# Patient Record
Sex: Male | Born: 2000 | Race: Black or African American | Hispanic: No | Marital: Single | Smoking: Never smoker
Health system: Southern US, Community
[De-identification: ages and names within clinical notes are randomized; demographics above are authoritative.]

## PROBLEM LIST (undated history)

## (undated) DIAGNOSIS — A64 Unspecified sexually transmitted disease: Secondary | ICD-10-CM

## (undated) DIAGNOSIS — J45909 Unspecified asthma, uncomplicated: Secondary | ICD-10-CM

## (undated) HISTORY — DX: Unspecified asthma, uncomplicated: J45.909

## (undated) HISTORY — PX: WISDOM TOOTH EXTRACTION: SHX21

## (undated) HISTORY — DX: Unspecified sexually transmitted disease: A64

---
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
## 2019-07-17 IMAGING — CT CT ABD-PELV W/ CM
2 of 4 series · 16 of 46 positions shown, 18 images · IV contrast (APPLIED)
Comparison: None.

CLINICAL DATA: 16 y/o  M; lower abdominal pain and blood in stool.

EXAM:
CT ABDOMEN AND PELVIS WITH CONTRAST
TECHNIQUE: Multidetector CT imaging of the abdomen and pelvis was performed
using the standard protocol following bolus administration of
intravenous contrast.
CONTRAST:  100mL QONZW0-JXX IOPAMIDOL (QONZW0-JXX) INJECTION 61%

[Series 2: routine abd/pel with · axial · 0.66mm/px · z∈[-566,-146]mm · 13 of 94 slices shown, 15 images]
[im 5/94  soft-tissue]
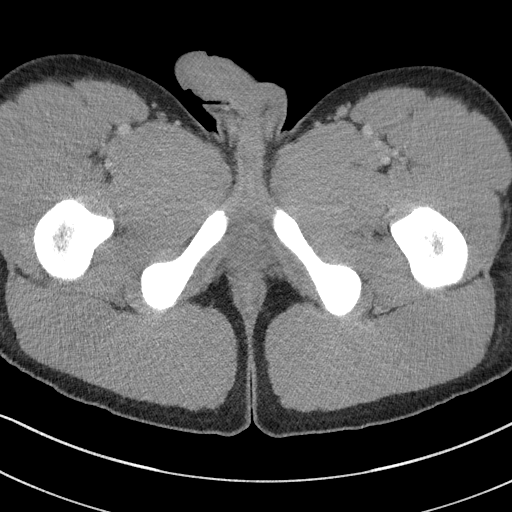
[im 5/94  bone]
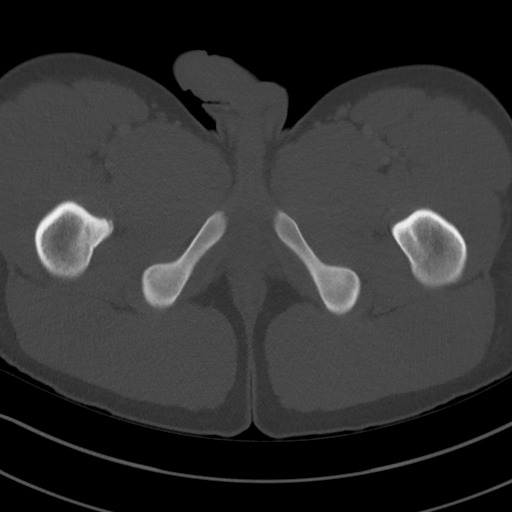
[im 13/94  soft-tissue]
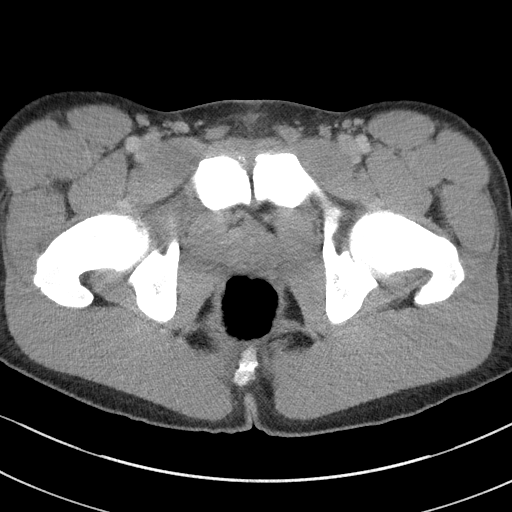
[im 21/94  soft-tissue]
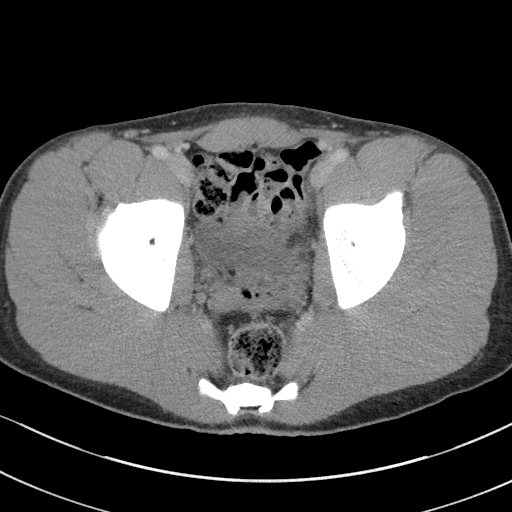
[im 25/94  soft-tissue]
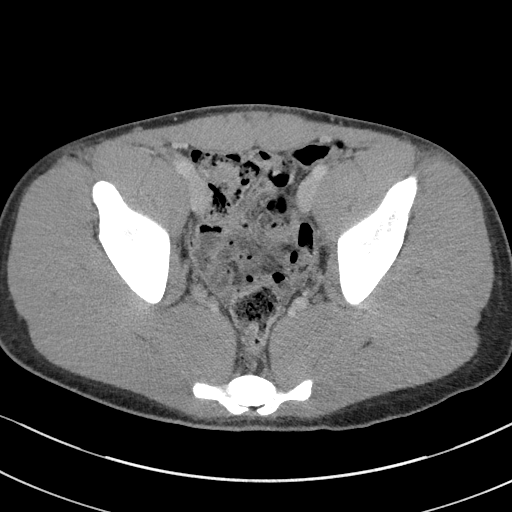
[im 33/94  soft-tissue]
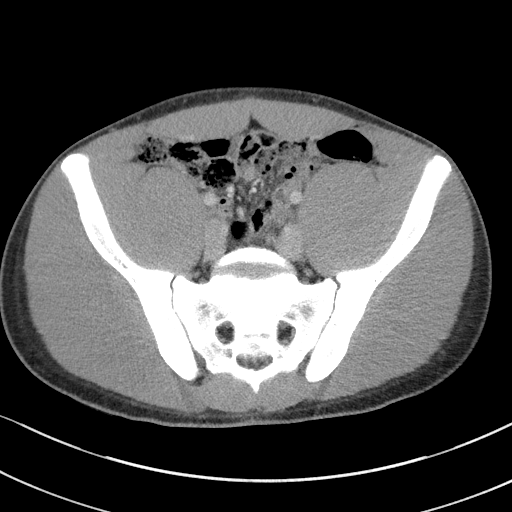
[im 41/94  soft-tissue]
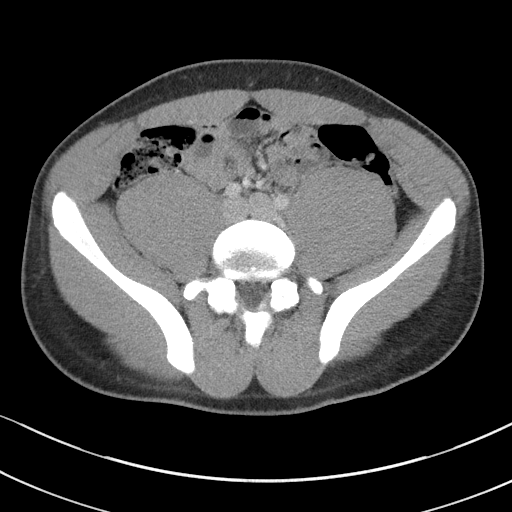
[im 49/94  soft-tissue]
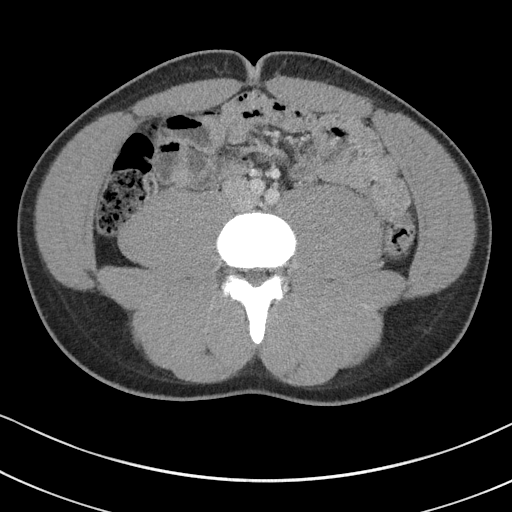
[im 53/94  soft-tissue]
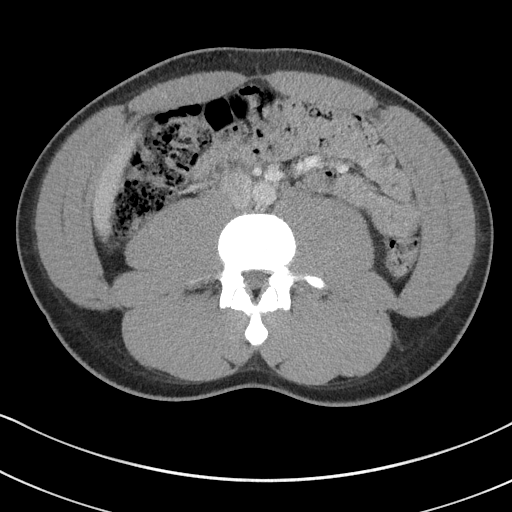
[im 61/94  soft-tissue]
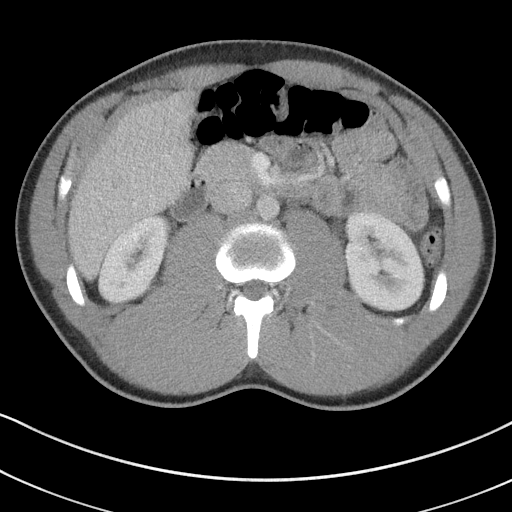
[im 61/94  bone]
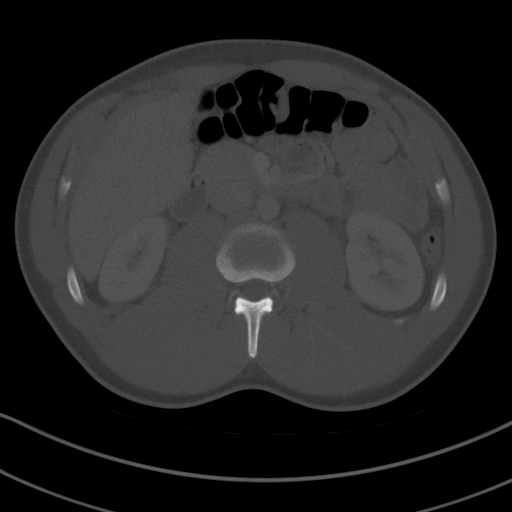
[im 69/94  soft-tissue]
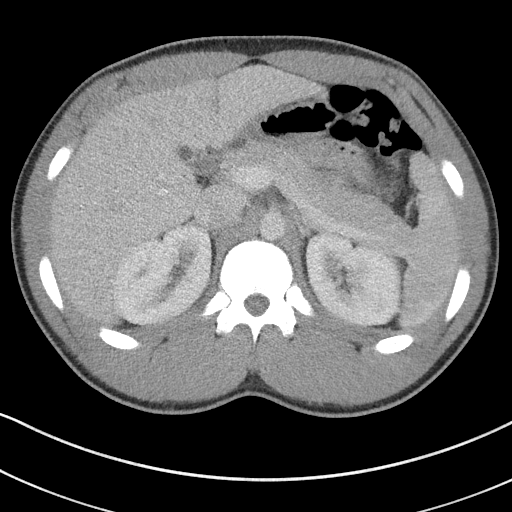
[im 73/94  soft-tissue]
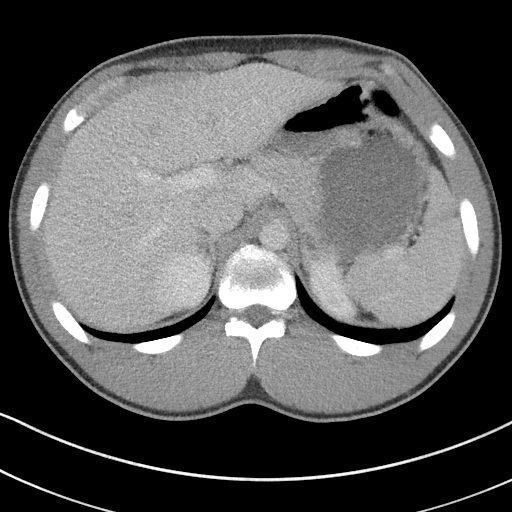
[im 81/94  soft-tissue]
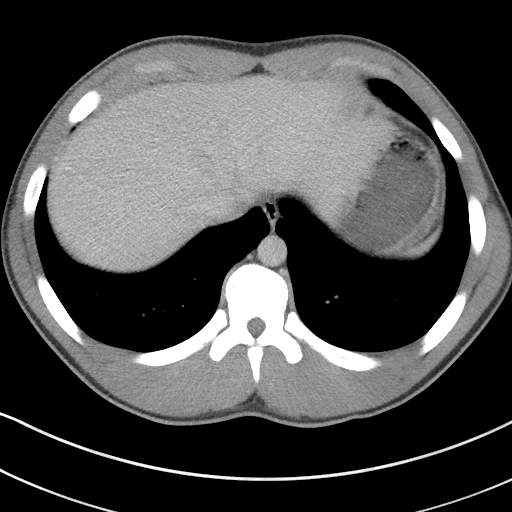
[im 89/94  soft-tissue]
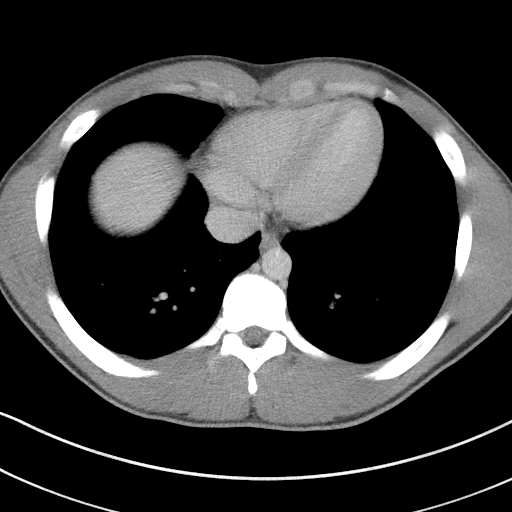

[Series 5: coronal st · coronal · 0.68mm/px · 3 of 89 slices shown]
[im 30/89  soft-tissue]
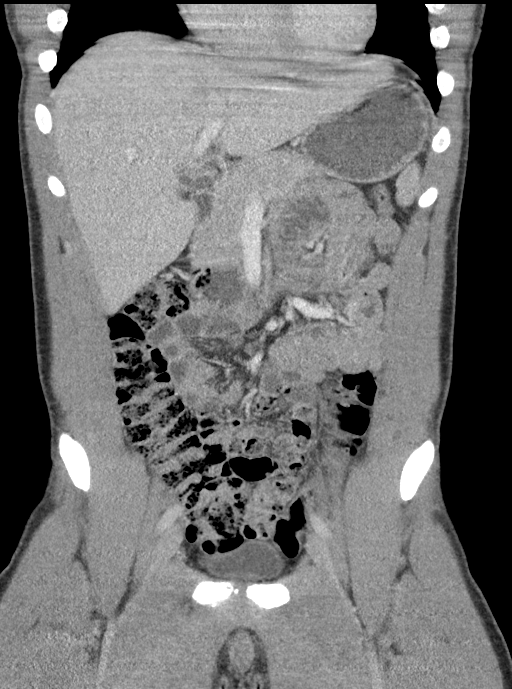
[im 40/89  soft-tissue]
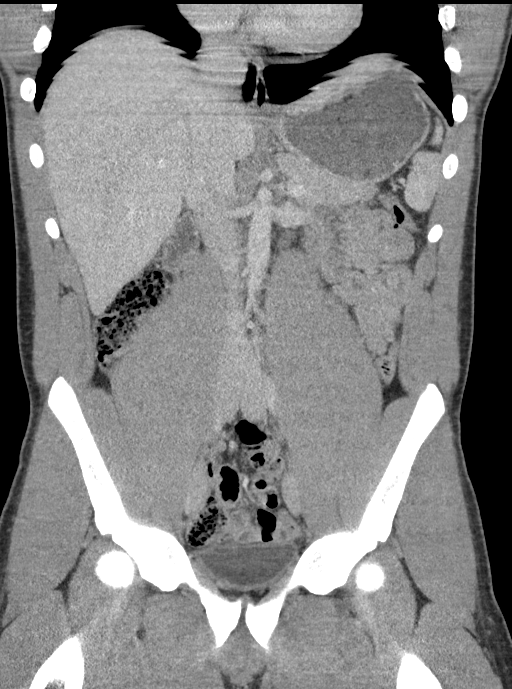
[im 49/89  soft-tissue]
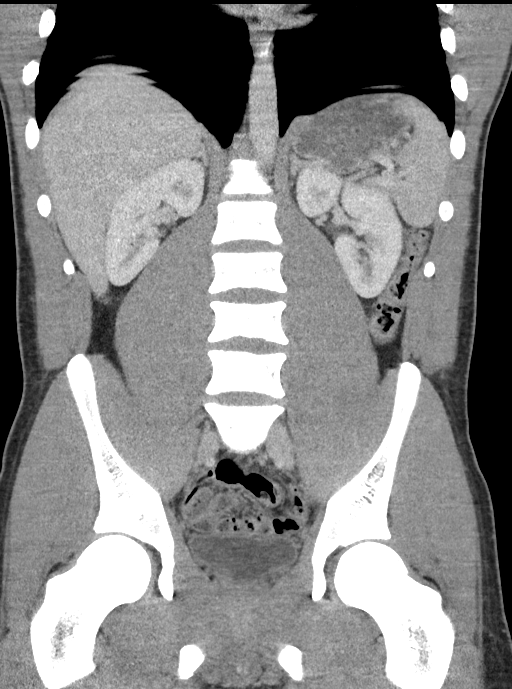

[16 of 46 positions shown; findings below may reference images not displayed]

FINDINGS: Lower chest: No acute abnormality.

Hepatobiliary: No focal liver abnormality is seen. No gallstones,
gallbladder wall thickening, or biliary dilatation.

Pancreas: Unremarkable. No pancreatic ductal dilatation or
surrounding inflammatory changes.

Spleen: Normal in size without focal abnormality.

Adrenals/Urinary Tract: Adrenal glands are unremarkable. Kidneys are
normal, without renal calculi, focal lesion, or hydronephrosis.
Bladder is unremarkable.

Stomach/Bowel: Stomach is within normal limits. Appendix appears
normal. No evidence of bowel wall thickening, distention, or
inflammatory changes.

Vascular/Lymphatic: No significant vascular findings are present. No
enlarged abdominal or pelvic lymph nodes.

Reproductive: Prostate is unremarkable.

Other: No abdominal wall hernia or abnormality. No abdominopelvic
ascites.

Musculoskeletal: Minimal grade 1 L5-S1 anterolisthesis and chronic
bilateral L5 pars defects.
IMPRESSION: 1. No acute process identified.
2. Minimal grade 1 L5-S1 anterolisthesis and chronic bilateral L5
pars defects.
3. Otherwise unremarkable CT of the abdomen and pelvis.

By: Kusum Alvarez M.D.

## 2019-09-11 DIAGNOSIS — S83249A Other tear of medial meniscus, current injury, unspecified knee, initial encounter: Secondary | ICD-10-CM

## 2019-09-11 DIAGNOSIS — S83519A Sprain of anterior cruciate ligament of unspecified knee, initial encounter: Secondary | ICD-10-CM

## 2019-09-11 HISTORY — PX: OTHER SURGICAL HISTORY: SHX169

## 2019-09-11 HISTORY — DX: Sprain of anterior cruciate ligament of unspecified knee, initial encounter: S83.519A

## 2019-09-11 HISTORY — DX: Other tear of medial meniscus, current injury, unspecified knee, initial encounter: S83.249A

## 2019-09-25 ENCOUNTER — Ambulatory Visit: Payer: Medicaid Other | Attending: Internal Medicine

## 2019-09-25 DIAGNOSIS — Z20822 Contact with and (suspected) exposure to covid-19: Secondary | ICD-10-CM

## 2019-09-26 LAB — NOVEL CORONAVIRUS, NAA: SARS-CoV-2, NAA: NOT DETECTED

## 2019-12-20 DIAGNOSIS — M6282 Rhabdomyolysis: Secondary | ICD-10-CM | POA: Diagnosis not present

## 2019-12-20 DIAGNOSIS — T675XXA Heat exhaustion, unspecified, initial encounter: Secondary | ICD-10-CM | POA: Diagnosis not present

## 2019-12-20 DIAGNOSIS — E860 Dehydration: Secondary | ICD-10-CM | POA: Diagnosis not present

## 2019-12-20 DIAGNOSIS — R748 Abnormal levels of other serum enzymes: Secondary | ICD-10-CM | POA: Diagnosis not present

## 2020-03-10 DIAGNOSIS — Z419 Encounter for procedure for purposes other than remedying health state, unspecified: Secondary | ICD-10-CM | POA: Diagnosis not present

## 2020-04-06 DIAGNOSIS — R369 Urethral discharge, unspecified: Secondary | ICD-10-CM | POA: Diagnosis not present

## 2020-04-06 DIAGNOSIS — Z113 Encounter for screening for infections with a predominantly sexual mode of transmission: Secondary | ICD-10-CM | POA: Diagnosis not present

## 2020-04-10 DIAGNOSIS — Z419 Encounter for procedure for purposes other than remedying health state, unspecified: Secondary | ICD-10-CM | POA: Diagnosis not present

## 2020-05-04 DIAGNOSIS — Z20822 Contact with and (suspected) exposure to covid-19: Secondary | ICD-10-CM | POA: Diagnosis not present

## 2020-05-11 DIAGNOSIS — Z419 Encounter for procedure for purposes other than remedying health state, unspecified: Secondary | ICD-10-CM | POA: Diagnosis not present

## 2020-05-17 DIAGNOSIS — Z03818 Encounter for observation for suspected exposure to other biological agents ruled out: Secondary | ICD-10-CM | POA: Diagnosis not present

## 2020-06-10 DIAGNOSIS — Z419 Encounter for procedure for purposes other than remedying health state, unspecified: Secondary | ICD-10-CM | POA: Diagnosis not present

## 2020-06-13 DIAGNOSIS — M25561 Pain in right knee: Secondary | ICD-10-CM | POA: Diagnosis not present

## 2020-07-11 DIAGNOSIS — Z419 Encounter for procedure for purposes other than remedying health state, unspecified: Secondary | ICD-10-CM | POA: Diagnosis not present

## 2020-07-13 DIAGNOSIS — M25461 Effusion, right knee: Secondary | ICD-10-CM | POA: Diagnosis not present

## 2020-07-13 DIAGNOSIS — S83271D Complex tear of lateral meniscus, current injury, right knee, subsequent encounter: Secondary | ICD-10-CM | POA: Diagnosis not present

## 2020-07-13 DIAGNOSIS — S83511D Sprain of anterior cruciate ligament of right knee, subsequent encounter: Secondary | ICD-10-CM | POA: Diagnosis not present

## 2020-07-13 DIAGNOSIS — M25569 Pain in unspecified knee: Secondary | ICD-10-CM | POA: Diagnosis not present

## 2020-07-13 DIAGNOSIS — S83231D Complex tear of medial meniscus, current injury, right knee, subsequent encounter: Secondary | ICD-10-CM | POA: Diagnosis not present

## 2020-07-13 DIAGNOSIS — M25561 Pain in right knee: Secondary | ICD-10-CM | POA: Diagnosis not present

## 2020-08-10 DIAGNOSIS — Z419 Encounter for procedure for purposes other than remedying health state, unspecified: Secondary | ICD-10-CM | POA: Diagnosis not present

## 2020-09-07 DIAGNOSIS — M23611 Other spontaneous disruption of anterior cruciate ligament of right knee: Secondary | ICD-10-CM | POA: Diagnosis not present

## 2020-09-07 DIAGNOSIS — S83281A Other tear of lateral meniscus, current injury, right knee, initial encounter: Secondary | ICD-10-CM | POA: Diagnosis not present

## 2020-09-07 DIAGNOSIS — S83511A Sprain of anterior cruciate ligament of right knee, initial encounter: Secondary | ICD-10-CM | POA: Diagnosis not present

## 2020-09-07 DIAGNOSIS — Z20822 Contact with and (suspected) exposure to covid-19: Secondary | ICD-10-CM | POA: Diagnosis not present

## 2020-09-07 DIAGNOSIS — S83271A Complex tear of lateral meniscus, current injury, right knee, initial encounter: Secondary | ICD-10-CM | POA: Diagnosis not present

## 2020-09-07 DIAGNOSIS — S83231A Complex tear of medial meniscus, current injury, right knee, initial encounter: Secondary | ICD-10-CM | POA: Diagnosis not present

## 2020-09-07 DIAGNOSIS — S83241A Other tear of medial meniscus, current injury, right knee, initial encounter: Secondary | ICD-10-CM | POA: Diagnosis not present

## 2020-09-10 DIAGNOSIS — Z419 Encounter for procedure for purposes other than remedying health state, unspecified: Secondary | ICD-10-CM | POA: Diagnosis not present

## 2020-10-11 DIAGNOSIS — Z419 Encounter for procedure for purposes other than remedying health state, unspecified: Secondary | ICD-10-CM | POA: Diagnosis not present

## 2020-11-08 DIAGNOSIS — Z419 Encounter for procedure for purposes other than remedying health state, unspecified: Secondary | ICD-10-CM | POA: Diagnosis not present

## 2020-11-09 DIAGNOSIS — M75121 Complete rotator cuff tear or rupture of right shoulder, not specified as traumatic: Secondary | ICD-10-CM | POA: Diagnosis not present

## 2020-12-06 DIAGNOSIS — Z9889 Other specified postprocedural states: Secondary | ICD-10-CM | POA: Diagnosis not present

## 2020-12-06 DIAGNOSIS — R262 Difficulty in walking, not elsewhere classified: Secondary | ICD-10-CM | POA: Diagnosis not present

## 2020-12-06 DIAGNOSIS — S83511D Sprain of anterior cruciate ligament of right knee, subsequent encounter: Secondary | ICD-10-CM | POA: Diagnosis not present

## 2020-12-06 DIAGNOSIS — M25561 Pain in right knee: Secondary | ICD-10-CM | POA: Diagnosis not present

## 2020-12-08 DIAGNOSIS — Z9889 Other specified postprocedural states: Secondary | ICD-10-CM | POA: Diagnosis not present

## 2020-12-08 DIAGNOSIS — R262 Difficulty in walking, not elsewhere classified: Secondary | ICD-10-CM | POA: Diagnosis not present

## 2020-12-08 DIAGNOSIS — S83511D Sprain of anterior cruciate ligament of right knee, subsequent encounter: Secondary | ICD-10-CM | POA: Diagnosis not present

## 2020-12-08 DIAGNOSIS — M25561 Pain in right knee: Secondary | ICD-10-CM | POA: Diagnosis not present

## 2020-12-09 DIAGNOSIS — Z419 Encounter for procedure for purposes other than remedying health state, unspecified: Secondary | ICD-10-CM | POA: Diagnosis not present

## 2020-12-19 DIAGNOSIS — Z9889 Other specified postprocedural states: Secondary | ICD-10-CM | POA: Diagnosis not present

## 2020-12-19 DIAGNOSIS — M25561 Pain in right knee: Secondary | ICD-10-CM | POA: Diagnosis not present

## 2020-12-26 DIAGNOSIS — S83511D Sprain of anterior cruciate ligament of right knee, subsequent encounter: Secondary | ICD-10-CM | POA: Diagnosis not present

## 2020-12-26 DIAGNOSIS — Z9889 Other specified postprocedural states: Secondary | ICD-10-CM | POA: Diagnosis not present

## 2020-12-26 DIAGNOSIS — R262 Difficulty in walking, not elsewhere classified: Secondary | ICD-10-CM | POA: Diagnosis not present

## 2020-12-26 DIAGNOSIS — M25561 Pain in right knee: Secondary | ICD-10-CM | POA: Diagnosis not present

## 2021-01-08 DIAGNOSIS — Z419 Encounter for procedure for purposes other than remedying health state, unspecified: Secondary | ICD-10-CM | POA: Diagnosis not present

## 2021-02-08 DIAGNOSIS — Z419 Encounter for procedure for purposes other than remedying health state, unspecified: Secondary | ICD-10-CM | POA: Diagnosis not present

## 2021-03-10 DIAGNOSIS — Z419 Encounter for procedure for purposes other than remedying health state, unspecified: Secondary | ICD-10-CM | POA: Diagnosis not present

## 2021-04-10 DIAGNOSIS — Z419 Encounter for procedure for purposes other than remedying health state, unspecified: Secondary | ICD-10-CM | POA: Diagnosis not present

## 2021-05-09 DIAGNOSIS — Z9889 Other specified postprocedural states: Secondary | ICD-10-CM | POA: Diagnosis not present

## 2021-05-09 DIAGNOSIS — S83511D Sprain of anterior cruciate ligament of right knee, subsequent encounter: Secondary | ICD-10-CM | POA: Diagnosis not present

## 2021-05-09 DIAGNOSIS — R29898 Other symptoms and signs involving the musculoskeletal system: Secondary | ICD-10-CM | POA: Diagnosis not present

## 2021-05-11 DIAGNOSIS — Z419 Encounter for procedure for purposes other than remedying health state, unspecified: Secondary | ICD-10-CM | POA: Diagnosis not present

## 2021-05-22 DIAGNOSIS — Z20822 Contact with and (suspected) exposure to covid-19: Secondary | ICD-10-CM | POA: Diagnosis not present

## 2021-06-10 DIAGNOSIS — Z419 Encounter for procedure for purposes other than remedying health state, unspecified: Secondary | ICD-10-CM | POA: Diagnosis not present

## 2021-06-27 DIAGNOSIS — M25561 Pain in right knee: Secondary | ICD-10-CM | POA: Diagnosis not present

## 2021-06-27 DIAGNOSIS — Z9889 Other specified postprocedural states: Secondary | ICD-10-CM | POA: Diagnosis not present

## 2021-06-27 DIAGNOSIS — R29898 Other symptoms and signs involving the musculoskeletal system: Secondary | ICD-10-CM | POA: Diagnosis not present

## 2021-06-27 DIAGNOSIS — R262 Difficulty in walking, not elsewhere classified: Secondary | ICD-10-CM | POA: Diagnosis not present

## 2021-06-27 DIAGNOSIS — S83511D Sprain of anterior cruciate ligament of right knee, subsequent encounter: Secondary | ICD-10-CM | POA: Diagnosis not present

## 2021-07-05 DIAGNOSIS — R29898 Other symptoms and signs involving the musculoskeletal system: Secondary | ICD-10-CM | POA: Diagnosis not present

## 2021-07-05 DIAGNOSIS — Z9889 Other specified postprocedural states: Secondary | ICD-10-CM | POA: Diagnosis not present

## 2021-07-05 DIAGNOSIS — R262 Difficulty in walking, not elsewhere classified: Secondary | ICD-10-CM | POA: Diagnosis not present

## 2021-07-05 DIAGNOSIS — S83511D Sprain of anterior cruciate ligament of right knee, subsequent encounter: Secondary | ICD-10-CM | POA: Diagnosis not present

## 2021-07-05 DIAGNOSIS — M25561 Pain in right knee: Secondary | ICD-10-CM | POA: Diagnosis not present

## 2021-07-11 DIAGNOSIS — Z419 Encounter for procedure for purposes other than remedying health state, unspecified: Secondary | ICD-10-CM | POA: Diagnosis not present

## 2021-07-19 DIAGNOSIS — R29898 Other symptoms and signs involving the musculoskeletal system: Secondary | ICD-10-CM | POA: Diagnosis not present

## 2021-07-19 DIAGNOSIS — Z9889 Other specified postprocedural states: Secondary | ICD-10-CM | POA: Diagnosis not present

## 2021-07-19 DIAGNOSIS — S83511D Sprain of anterior cruciate ligament of right knee, subsequent encounter: Secondary | ICD-10-CM | POA: Diagnosis not present

## 2021-07-19 DIAGNOSIS — M25561 Pain in right knee: Secondary | ICD-10-CM | POA: Diagnosis not present

## 2021-07-27 DIAGNOSIS — R29898 Other symptoms and signs involving the musculoskeletal system: Secondary | ICD-10-CM | POA: Diagnosis not present

## 2021-07-27 DIAGNOSIS — M25561 Pain in right knee: Secondary | ICD-10-CM | POA: Diagnosis not present

## 2021-07-27 DIAGNOSIS — S83511D Sprain of anterior cruciate ligament of right knee, subsequent encounter: Secondary | ICD-10-CM | POA: Diagnosis not present

## 2021-07-27 DIAGNOSIS — Z9889 Other specified postprocedural states: Secondary | ICD-10-CM | POA: Diagnosis not present

## 2021-08-10 DIAGNOSIS — Z419 Encounter for procedure for purposes other than remedying health state, unspecified: Secondary | ICD-10-CM | POA: Diagnosis not present

## 2021-09-10 DIAGNOSIS — Z419 Encounter for procedure for purposes other than remedying health state, unspecified: Secondary | ICD-10-CM | POA: Diagnosis not present

## 2021-10-11 DIAGNOSIS — Z419 Encounter for procedure for purposes other than remedying health state, unspecified: Secondary | ICD-10-CM | POA: Diagnosis not present

## 2021-11-08 DIAGNOSIS — Z419 Encounter for procedure for purposes other than remedying health state, unspecified: Secondary | ICD-10-CM | POA: Diagnosis not present

## 2021-12-09 DIAGNOSIS — Z419 Encounter for procedure for purposes other than remedying health state, unspecified: Secondary | ICD-10-CM | POA: Diagnosis not present

## 2022-01-08 DIAGNOSIS — Z419 Encounter for procedure for purposes other than remedying health state, unspecified: Secondary | ICD-10-CM | POA: Diagnosis not present

## 2022-02-08 DIAGNOSIS — Z419 Encounter for procedure for purposes other than remedying health state, unspecified: Secondary | ICD-10-CM | POA: Diagnosis not present

## 2022-03-10 DIAGNOSIS — Z419 Encounter for procedure for purposes other than remedying health state, unspecified: Secondary | ICD-10-CM | POA: Diagnosis not present

## 2022-04-10 DIAGNOSIS — Z419 Encounter for procedure for purposes other than remedying health state, unspecified: Secondary | ICD-10-CM | POA: Diagnosis not present

## 2022-05-11 DIAGNOSIS — Z419 Encounter for procedure for purposes other than remedying health state, unspecified: Secondary | ICD-10-CM | POA: Diagnosis not present

## 2022-06-10 DIAGNOSIS — Z419 Encounter for procedure for purposes other than remedying health state, unspecified: Secondary | ICD-10-CM | POA: Diagnosis not present

## 2022-07-11 DIAGNOSIS — Z419 Encounter for procedure for purposes other than remedying health state, unspecified: Secondary | ICD-10-CM | POA: Diagnosis not present

## 2022-08-10 DIAGNOSIS — Z419 Encounter for procedure for purposes other than remedying health state, unspecified: Secondary | ICD-10-CM | POA: Diagnosis not present

## 2022-09-01 DIAGNOSIS — S0990XA Unspecified injury of head, initial encounter: Secondary | ICD-10-CM | POA: Diagnosis not present

## 2022-09-01 DIAGNOSIS — M545 Low back pain, unspecified: Secondary | ICD-10-CM | POA: Diagnosis not present

## 2022-09-01 DIAGNOSIS — S39012A Strain of muscle, fascia and tendon of lower back, initial encounter: Secondary | ICD-10-CM | POA: Diagnosis not present

## 2022-09-01 DIAGNOSIS — S199XXA Unspecified injury of neck, initial encounter: Secondary | ICD-10-CM | POA: Diagnosis not present

## 2022-09-01 DIAGNOSIS — J3489 Other specified disorders of nose and nasal sinuses: Secondary | ICD-10-CM | POA: Diagnosis not present

## 2022-09-01 DIAGNOSIS — R519 Headache, unspecified: Secondary | ICD-10-CM | POA: Diagnosis not present

## 2022-09-01 DIAGNOSIS — Y92410 Unspecified street and highway as the place of occurrence of the external cause: Secondary | ICD-10-CM | POA: Insufficient documentation

## 2022-09-01 DIAGNOSIS — Z5321 Procedure and treatment not carried out due to patient leaving prior to being seen by health care provider: Secondary | ICD-10-CM | POA: Diagnosis not present

## 2022-09-02 ENCOUNTER — Encounter: Payer: Self-pay | Admitting: Emergency Medicine

## 2022-09-02 ENCOUNTER — Emergency Department
Admission: EM | Admit: 2022-09-02 | Discharge: 2022-09-02 | Discharge status: Against Medical Advice | Payer: Medicaid Other | Attending: Emergency Medicine | Admitting: Emergency Medicine

## 2022-09-02 ENCOUNTER — Emergency Department: Payer: Medicaid Other

## 2022-09-02 ENCOUNTER — Emergency Department
Admission: EM | Admit: 2022-09-02 | Discharge: 2022-09-02 | Disposition: A | Discharge status: Home / Self Care | Payer: Medicaid Other | Attending: Emergency Medicine | Admitting: Emergency Medicine

## 2022-09-02 DIAGNOSIS — S39012A Strain of muscle, fascia and tendon of lower back, initial encounter: Secondary | ICD-10-CM | POA: Diagnosis not present

## 2022-09-02 DIAGNOSIS — Y92410 Unspecified street and highway as the place of occurrence of the external cause: Secondary | ICD-10-CM | POA: Diagnosis not present

## 2022-09-02 DIAGNOSIS — M545 Low back pain, unspecified: Secondary | ICD-10-CM | POA: Diagnosis present

## 2022-09-02 DIAGNOSIS — S199XXA Unspecified injury of neck, initial encounter: Secondary | ICD-10-CM | POA: Diagnosis not present

## 2022-09-02 DIAGNOSIS — J3489 Other specified disorders of nose and nasal sinuses: Secondary | ICD-10-CM | POA: Diagnosis not present

## 2022-09-02 DIAGNOSIS — S0990XA Unspecified injury of head, initial encounter: Secondary | ICD-10-CM | POA: Diagnosis not present

## 2022-09-02 MED ORDER — CYCLOBENZAPRINE HCL 5 MG PO TABS: 5.0000 mg | ORAL_TABLET | Freq: Three times a day (TID) | ORAL | 0 refills | Status: DC | PRN

## 2022-09-02 MED ORDER — IBUPROFEN 800 MG PO TABS: 800.0000 mg | ORAL_TABLET | Freq: Four times a day (QID) | ORAL | 0 refills | Status: AC | PRN

## 2022-09-02 NOTE — ED Notes (Signed)
Pt walked in from outside. ED discharge undone.
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
## 2022-09-10 DIAGNOSIS — Z419 Encounter for procedure for purposes other than remedying health state, unspecified: Secondary | ICD-10-CM | POA: Diagnosis not present

## 2022-10-22 DIAGNOSIS — M25561 Pain in right knee: Secondary | ICD-10-CM | POA: Diagnosis not present

## 2022-10-22 DIAGNOSIS — S8991XA Unspecified injury of right lower leg, initial encounter: Secondary | ICD-10-CM | POA: Diagnosis not present

## 2022-10-22 DIAGNOSIS — M2391 Unspecified internal derangement of right knee: Secondary | ICD-10-CM | POA: Diagnosis not present

## 2022-12-10 DIAGNOSIS — Z419 Encounter for procedure for purposes other than remedying health state, unspecified: Secondary | ICD-10-CM | POA: Diagnosis not present

## 2023-01-09 DIAGNOSIS — Z419 Encounter for procedure for purposes other than remedying health state, unspecified: Secondary | ICD-10-CM | POA: Diagnosis not present

## 2023-02-09 DIAGNOSIS — Z419 Encounter for procedure for purposes other than remedying health state, unspecified: Secondary | ICD-10-CM | POA: Diagnosis not present

## 2023-02-26 DIAGNOSIS — M25572 Pain in left ankle and joints of left foot: Secondary | ICD-10-CM | POA: Diagnosis not present

## 2023-02-26 DIAGNOSIS — M79672 Pain in left foot: Secondary | ICD-10-CM | POA: Diagnosis not present

## 2023-03-10 ENCOUNTER — Emergency Department: Payer: Medicaid Other

## 2023-03-10 ENCOUNTER — Other Ambulatory Visit: Payer: Self-pay

## 2023-03-10 ENCOUNTER — Emergency Department
Admission: EM | Admit: 2023-03-10 | Discharge: 2023-03-10 | Disposition: A | Discharge status: Home / Self Care | Payer: Medicaid Other | Attending: Emergency Medicine | Admitting: Emergency Medicine

## 2023-03-10 DIAGNOSIS — M25561 Pain in right knee: Secondary | ICD-10-CM | POA: Diagnosis not present

## 2023-03-10 DIAGNOSIS — S8391XA Sprain of unspecified site of right knee, initial encounter: Secondary | ICD-10-CM | POA: Insufficient documentation

## 2023-03-10 DIAGNOSIS — Y9367 Activity, basketball: Secondary | ICD-10-CM | POA: Insufficient documentation

## 2023-03-10 DIAGNOSIS — W500XXA Accidental hit or strike by another person, initial encounter: Secondary | ICD-10-CM | POA: Insufficient documentation

## 2023-03-10 DIAGNOSIS — S8991XA Unspecified injury of right lower leg, initial encounter: Secondary | ICD-10-CM | POA: Diagnosis not present

## 2023-03-10 DIAGNOSIS — S838X1A Sprain of other specified parts of right knee, initial encounter: Secondary | ICD-10-CM | POA: Diagnosis not present

## 2023-03-10 MED ORDER — IBUPROFEN 800 MG PO TABS: 800.0000 mg | ORAL_TABLET | Freq: Three times a day (TID) | ORAL | 0 refills | Status: DC | PRN

## 2023-03-10 MED ORDER — IBUPROFEN 800 MG PO TABS
800.0000 mg | ORAL_TABLET | Freq: Once | ORAL | Status: AC
  Administered 2023-03-10: 800 mg via ORAL
  Filled 2023-03-10: qty 1

## 2023-03-10 MED ORDER — CYCLOBENZAPRINE HCL 5 MG PO TABS: 5.0000 mg | ORAL_TABLET | Freq: Three times a day (TID) | ORAL | 0 refills | Status: DC | PRN

## 2023-03-10 NOTE — Discharge Instructions (Addendum)
Wear the knee brace or Ace bandage as discussed.  May weight-bear as tolerated using the crutches to ambulate.  Rest with the knee elevated and apply ice to reduce swelling.  Take the anti-inflammatory muscle laxer as needed.  Follow-up with [facility] for further evaluation management of your knee injury.

## 2023-03-10 NOTE — ED Notes (Signed)
Pt verbalizes understanding of discharge instructions. Opportunity for questioning and answers were provided. Pt discharged from ED to home.   ? ?
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
## 2023-03-11 DIAGNOSIS — Z419 Encounter for procedure for purposes other than remedying health state, unspecified: Secondary | ICD-10-CM | POA: Diagnosis not present
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
## 2023-03-13 ENCOUNTER — Other Ambulatory Visit: Payer: Self-pay | Admitting: Student

## 2023-03-13 ENCOUNTER — Ambulatory Visit
Admission: RE | Admit: 2023-03-13 | Discharge: 2023-03-13 | Disposition: A | Discharge status: Home / Self Care | Payer: Medicaid Other | Source: Ambulatory Visit | Attending: Student | Admitting: Student

## 2023-03-13 DIAGNOSIS — S83241A Other tear of medial meniscus, current injury, right knee, initial encounter: Secondary | ICD-10-CM

## 2023-03-13 DIAGNOSIS — M25461 Effusion, right knee: Secondary | ICD-10-CM | POA: Diagnosis not present

## 2023-03-13 DIAGNOSIS — M25561 Pain in right knee: Secondary | ICD-10-CM | POA: Insufficient documentation

## 2023-03-13 DIAGNOSIS — S8991XA Unspecified injury of right lower leg, initial encounter: Secondary | ICD-10-CM | POA: Diagnosis not present

## 2023-03-13 DIAGNOSIS — Z9889 Other specified postprocedural states: Secondary | ICD-10-CM | POA: Diagnosis not present

## 2023-03-13 DIAGNOSIS — M2391 Unspecified internal derangement of right knee: Secondary | ICD-10-CM | POA: Diagnosis not present

## 2023-03-28 DIAGNOSIS — S83231A Complex tear of medial meniscus, current injury, right knee, initial encounter: Secondary | ICD-10-CM | POA: Diagnosis not present

## 2023-04-01 ENCOUNTER — Other Ambulatory Visit: Payer: Self-pay | Admitting: Orthopedic Surgery

## 2023-04-04 ENCOUNTER — Other Ambulatory Visit: Payer: Self-pay

## 2023-04-04 ENCOUNTER — Encounter
Admission: RE | Admit: 2023-04-04 | Discharge: 2023-04-04 | Disposition: A | Discharge status: Home / Self Care | Payer: Medicaid Other | Source: Ambulatory Visit | Attending: Orthopedic Surgery | Admitting: Orthopedic Surgery

## 2023-04-04 NOTE — Patient Instructions (Addendum)
Your procedure is scheduled on: 04/09/23 - Tuesday Report to the Registration Desk on the 1st floor of the [facility]. To find out your arrival time, please call [phone] between 1PM - 3PM on: 04/08/23 - Monday If your arrival time is 6:00 am, do not arrive before that time as the [facility] entrance doors do not open until 6:00 am.  REMEMBER: Instructions that are not followed completely may result in serious medical risk, up to and including death; or upon the discretion of your surgeon and anesthesiologist your surgery may need to be rescheduled.  Do not eat food after midnight the night before surgery.  No gum chewing or hard candies.  You may however, drink CLEAR liquids up to 2 hours before you are scheduled to arrive for your surgery. Do not drink anything within 2 hours of your scheduled arrival time.  Clear liquids include: - water  - apple juice without pulp - gatorade (not RED colors) - black coffee or tea (Do NOT add milk or creamers to the coffee or tea) Do NOT drink anything that is not on this list.  In addition, your doctor has ordered for you to drink the provided:  Ensure Pre-Surgery Clear Carbohydrate Drink  Drinking this carbohydrate drink up to two hours before surgery helps to reduce insulin resistance and improve patient outcomes. Please complete drinking 2 hours before scheduled arrival time.  One week prior to surgery: Stop Anti-inflammatories (NSAIDS) such as Advil, Aleve, Ibuprofen, Motrin, Naproxen, Naprosyn and Aspirin based products such as Excedrin, Goody's Powder, BC Powder. You may to take Tylenol if needed for pain up until the day of surgery.  Stop ANY OVER THE COUNTER supplements until after surgery.   TAKE ONLY THESE MEDICATIONS THE MORNING OF SURGERY WITH A SIP OF WATER:  NONE   No Alcohol for 24 hours before or after surgery.  No Smoking including e-cigarettes for 24 hours before surgery.  No chewable tobacco products for at least 6  hours before surgery.  No nicotine patches on the day of surgery.  Do not use any "recreational" drugs for at least a week (preferably 2 weeks) before your surgery.  Please be advised that the combination of cocaine and anesthesia may have negative outcomes, up to and including death. If you test positive for cocaine, your surgery will be cancelled.  On the morning of surgery brush your teeth with toothpaste and water, you may rinse your mouth with mouthwash if you wish. Do not swallow any toothpaste or mouthwash.  Use CHG Soap or wipes as directed on instruction sheet.  Do not wear jewelry, make-up, hairpins, clips or nail polish.  Do not wear lotions, powders, or perfumes.   Do not shave body hair from the neck down 48 hours before surgery.  Contact lenses, hearing aids and dentures may not be worn into surgery.  Do not bring valuables to the hospital. [facility] is not responsible for any missing/lost belongings or valuables.   Notify your doctor if there is any change in your medical condition (cold, fever, infection).  Wear comfortable clothing (specific to your surgery type) to the hospital.  After surgery, you can help prevent lung complications by doing breathing exercises.  Take deep breaths and cough every 1-2 hours. Your doctor may order a device called an Incentive Spirometer to help you take deep breaths. When coughing or sneezing, hold a pillow firmly against your incision with both hands. This is called "splinting." Doing this helps protect your incision.  It also decreases belly discomfort.  If you are being admitted to the hospital overnight, leave your suitcase in the car. After surgery it may be brought to your room.  In case of increased patient census, it may be necessary for you, the patient, to continue your postoperative care in the Same Day Surgery department.  If you are being discharged the day of surgery, you will not be allowed to drive home. You will  need a responsible individual to drive you home and stay with you for 24 hours after surgery.   If you are taking public transportation, you will need to have a responsible individual with you.  Please call the Pre-admissions Testing Dept. at [phone] if you have any questions about these instructions.  Surgery Visitation Policy:  Patients having surgery or a procedure may have two visitors.  Children under the age of 65 must have an adult with them who is not the patient.  Inpatient Visitation:    Visiting hours are 7 a.m. to 8 p.m. Up to four visitors are allowed at one time in a patient room. The visitors may rotate out with other people during the day.  One visitor age 52 or older may stay with the patient overnight and must be in the room by 8 p.m.     Preparing for Surgery with CHLORHEXIDINE GLUCONATE (CHG) Soap  Chlorhexidine Gluconate (CHG) Soap  o An antiseptic cleaner that kills germs and bonds with the skin to continue killing germs even after washing  o Used for showering the night before surgery and morning of surgery  Before surgery, you can play an important role by reducing the number of germs on your skin.  CHG (Chlorhexidine gluconate) soap is an antiseptic cleanser which kills germs and bonds with the skin to continue killing germs even after washing.  Please do not use if you have an allergy to CHG or antibacterial soaps. If your skin becomes reddened/irritated stop using the CHG.  1. Shower the NIGHT BEFORE SURGERY and the MORNING OF SURGERY with CHG soap.  2. If you choose to wash your hair, wash your hair first as usual with your normal shampoo.  3. After shampooing, rinse your hair and body thoroughly to remove the shampoo.  4. Use CHG as you would any other liquid soap. You can apply CHG directly to the skin and wash gently with a scrungie or a clean washcloth.  5. Apply the CHG soap to your body only from the neck down. Do not use on open  wounds or open sores. Avoid contact with your eyes, ears, mouth, and genitals (private parts). Wash face and genitals (private parts) with your normal soap.  6. Wash thoroughly, paying special attention to the area where your surgery will be performed.  7. Thoroughly rinse your body with warm water.  8. Do not shower/wash with your normal soap after using and rinsing off the CHG soap.  9. Pat yourself dry with a clean towel.  10. Wear clean pajamas to bed the night before surgery.  12. Place clean sheets on your bed the night of your first shower and do not sleep with pets.  13. Shower again with the CHG soap on the day of surgery prior to arriving at the hospital.  14. Do not apply any deodorants/lotions/powders.  15. Please wear clean clothes to the hospital.

## 2023-04-08 MED ORDER — CEFAZOLIN SODIUM-DEXTROSE 2-4 GM/100ML-% IV SOLN
2.0000 g | INTRAVENOUS | Status: AC
  Administered 2023-04-09: 2 g via INTRAVENOUS

## 2023-04-08 MED ORDER — LACTATED RINGERS IV SOLN: INTRAVENOUS | Status: DC

## 2023-04-08 MED ORDER — FAMOTIDINE 20 MG PO TABS
20.0000 mg | ORAL_TABLET | Freq: Once | ORAL | Status: AC
  Administered 2023-04-09: 20 mg via ORAL

## 2023-04-08 MED ORDER — CHLORHEXIDINE GLUCONATE 0.12 % MT SOLN
15.0000 mL | Freq: Once | OROMUCOSAL | Status: AC
  Administered 2023-04-09: 15 mL via OROMUCOSAL

## 2023-04-08 MED ORDER — ORAL CARE MOUTH RINSE: 15.0000 mL | Freq: Once | OROMUCOSAL | Status: AC

## 2023-04-09 ENCOUNTER — Ambulatory Visit: Payer: Medicaid Other | Admitting: Certified Registered"

## 2023-04-09 ENCOUNTER — Encounter: Payer: Self-pay | Admitting: Orthopedic Surgery

## 2023-04-09 ENCOUNTER — Ambulatory Visit
Admission: RE | Admit: 2023-04-09 | Discharge: 2023-04-09 | Disposition: A | Discharge status: Home / Self Care | Payer: Medicaid Other | Attending: Orthopedic Surgery | Admitting: Orthopedic Surgery

## 2023-04-09 ENCOUNTER — Encounter
Admission: RE | Disposition: A | Discharge status: Home / Self Care | Payer: Self-pay | Source: Home / Self Care | Attending: Orthopedic Surgery

## 2023-04-09 ENCOUNTER — Other Ambulatory Visit: Payer: Self-pay

## 2023-04-09 DIAGNOSIS — M2341 Loose body in knee, right knee: Secondary | ICD-10-CM | POA: Diagnosis not present

## 2023-04-09 DIAGNOSIS — J45909 Unspecified asthma, uncomplicated: Secondary | ICD-10-CM | POA: Diagnosis not present

## 2023-04-09 DIAGNOSIS — S83211A Bucket-handle tear of medial meniscus, current injury, right knee, initial encounter: Secondary | ICD-10-CM | POA: Diagnosis not present

## 2023-04-09 DIAGNOSIS — Y9367 Activity, basketball: Secondary | ICD-10-CM | POA: Insufficient documentation

## 2023-04-09 DIAGNOSIS — S83231A Complex tear of medial meniscus, current injury, right knee, initial encounter: Secondary | ICD-10-CM | POA: Diagnosis not present

## 2023-04-09 DIAGNOSIS — Z1889 Other specified retained foreign body fragments: Secondary | ICD-10-CM | POA: Diagnosis not present

## 2023-04-09 HISTORY — PX: KNEE ARTHROSCOPY WITH MENISCAL REPAIR: SHX5653

## 2023-04-09 HISTORY — PX: CHONDROPLASTY: SHX5177

## 2023-04-09 HISTORY — PX: KNEE ARTHROSCOPY WITH MEDIAL MENISECTOMY: SHX5651

## 2023-04-09 SURGERY — ARTHROSCOPY, KNEE, WITH MENISCUS REPAIR
Anesthesia: General | Site: Knee | Laterality: Right

## 2023-04-09 MED ORDER — PROPOFOL 1000 MG/100ML IV EMUL
INTRAVENOUS | Status: AC
  Filled 2023-04-09: qty 100

## 2023-04-09 MED ORDER — PROPOFOL 10 MG/ML IV BOLUS
INTRAVENOUS | Status: DC | PRN
Start: 2023-04-09 — End: 2023-04-09
  Administered 2023-04-09: 200 mg via INTRAVENOUS
  Administered 2023-04-09: 150 ug/kg/min via INTRAVENOUS
  Administered 2023-04-09 (×2): 50 mg via INTRAVENOUS

## 2023-04-09 MED ORDER — ONDANSETRON HCL 4 MG/2ML IJ SOLN
INTRAMUSCULAR | Status: DC | PRN
  Administered 2023-04-09: 4 mg via INTRAVENOUS

## 2023-04-09 MED ORDER — BUPIVACAINE HCL (PF) 0.5 % IJ SOLN
INTRAMUSCULAR | Status: AC
  Filled 2023-04-09: qty 30

## 2023-04-09 MED ORDER — LIDOCAINE HCL (CARDIAC) PF 100 MG/5ML IV SOSY
PREFILLED_SYRINGE | INTRAVENOUS | Status: DC | PRN
  Administered 2023-04-09: 100 mg via INTRAVENOUS

## 2023-04-09 MED ORDER — FENTANYL CITRATE (PF) 100 MCG/2ML IJ SOLN
INTRAMUSCULAR | Status: DC | PRN
  Administered 2023-04-09: 100 ug via INTRAVENOUS

## 2023-04-09 MED ORDER — FAMOTIDINE 20 MG PO TABS
ORAL_TABLET | ORAL | Status: AC
  Filled 2023-04-09: qty 1

## 2023-04-09 MED ORDER — PROMETHAZINE HCL 25 MG/ML IJ SOLN: 6.2500 mg | INTRAMUSCULAR | Status: DC | PRN

## 2023-04-09 MED ORDER — DEXMEDETOMIDINE HCL IN NACL 80 MCG/20ML IV SOLN
INTRAVENOUS | Status: DC | PRN
  Administered 2023-04-09: 4 ug via INTRAVENOUS
  Administered 2023-04-09: 12 ug via INTRAVENOUS
  Administered 2023-04-09: 4 ug via INTRAVENOUS

## 2023-04-09 MED ORDER — LACTATED RINGERS IV SOLN
INTRAVENOUS | Status: DC | PRN
  Administered 2023-04-09: 3001 mL

## 2023-04-09 MED ORDER — HYDROMORPHONE HCL 1 MG/ML IJ SOLN
INTRAMUSCULAR | Status: AC
  Filled 2023-04-09: qty 1

## 2023-04-09 MED ORDER — PHENYLEPHRINE HCL (PRESSORS) 10 MG/ML IV SOLN
INTRAVENOUS | Status: DC | PRN
Start: 2023-04-09 — End: 2023-04-09
  Administered 2023-04-09 (×7): 80 ug via INTRAVENOUS

## 2023-04-09 MED ORDER — ONDANSETRON 4 MG PO TBDP: 4.0000 mg | ORAL_TABLET | Freq: Three times a day (TID) | ORAL | 0 refills | Status: AC | PRN

## 2023-04-09 MED ORDER — OXYCODONE HCL 5 MG/5ML PO SOLN: 5.0000 mg | Freq: Once | ORAL | Status: DC | PRN

## 2023-04-09 MED ORDER — PROPOFOL 10 MG/ML IV BOLUS
INTRAVENOUS | Status: AC
  Filled 2023-04-09: qty 20

## 2023-04-09 MED ORDER — ACETAMINOPHEN 10 MG/ML IV SOLN: 1000.0000 mg | Freq: Once | INTRAVENOUS | Status: DC | PRN

## 2023-04-09 MED ORDER — KETOROLAC TROMETHAMINE 30 MG/ML IJ SOLN
INTRAMUSCULAR | Status: DC | PRN
  Administered 2023-04-09: 30 mg via INTRAVENOUS

## 2023-04-09 MED ORDER — DEXAMETHASONE SODIUM PHOSPHATE 10 MG/ML IJ SOLN
INTRAMUSCULAR | Status: DC | PRN
  Administered 2023-04-09: 10 mg via INTRAVENOUS

## 2023-04-09 MED ORDER — SODIUM CHLORIDE 0.9 % IV SOLN
INTRAVENOUS | Status: DC | PRN
Start: 2023-04-09 — End: 2023-04-09

## 2023-04-09 MED ORDER — HYDROCODONE-ACETAMINOPHEN 5-325 MG PO TABS: 1.0000 | ORAL_TABLET | ORAL | 0 refills | Status: AC | PRN

## 2023-04-09 MED ORDER — HYDROMORPHONE HCL 1 MG/ML IJ SOLN
INTRAMUSCULAR | Status: DC | PRN
  Administered 2023-04-09: 1 mg via INTRAVENOUS

## 2023-04-09 MED ORDER — DROPERIDOL 2.5 MG/ML IJ SOLN: 0.6250 mg | Freq: Once | INTRAMUSCULAR | Status: DC | PRN

## 2023-04-09 MED ORDER — OXYCODONE HCL 5 MG PO TABS: 5.0000 mg | ORAL_TABLET | Freq: Once | ORAL | Status: DC | PRN

## 2023-04-09 MED ORDER — CEFAZOLIN SODIUM-DEXTROSE 2-4 GM/100ML-% IV SOLN
INTRAVENOUS | Status: AC
  Filled 2023-04-09: qty 100

## 2023-04-09 MED ORDER — IBUPROFEN 800 MG PO TABS: 800.0000 mg | ORAL_TABLET | Freq: Three times a day (TID) | ORAL | 0 refills | Status: AC

## 2023-04-09 MED ORDER — FENTANYL CITRATE (PF) 100 MCG/2ML IJ SOLN: 25.0000 ug | INTRAMUSCULAR | Status: DC | PRN

## 2023-04-09 MED ORDER — ACETAMINOPHEN 500 MG PO TABS: 1000.0000 mg | ORAL_TABLET | Freq: Three times a day (TID) | ORAL | 2 refills | Status: AC

## 2023-04-09 MED ORDER — METOPROLOL TARTRATE 5 MG/5ML IV SOLN
INTRAVENOUS | Status: DC | PRN
  Administered 2023-04-09: 2 mg via INTRAVENOUS

## 2023-04-09 MED ORDER — LIDOCAINE-EPINEPHRINE 1 %-1:100000 IJ SOLN
INTRAMUSCULAR | Status: AC
  Filled 2023-04-09: qty 1

## 2023-04-09 MED ORDER — SUGAMMADEX SODIUM 200 MG/2ML IV SOLN
INTRAVENOUS | Status: DC | PRN
  Administered 2023-04-09: 200 mg via INTRAVENOUS

## 2023-04-09 MED ORDER — EPINEPHRINE PF 1 MG/ML IJ SOLN
INTRAMUSCULAR | Status: AC
  Filled 2023-04-09: qty 1

## 2023-04-09 MED ORDER — LIDOCAINE-EPINEPHRINE 1 %-1:100000 IJ SOLN
INTRAMUSCULAR | Status: DC | PRN
  Administered 2023-04-09: 3 mL via INTRAMUSCULAR

## 2023-04-09 MED ORDER — CHLORHEXIDINE GLUCONATE 0.12 % MT SOLN
OROMUCOSAL | Status: AC
  Filled 2023-04-09: qty 15

## 2023-04-09 MED ORDER — ROCURONIUM BROMIDE 100 MG/10ML IV SOLN
INTRAVENOUS | Status: DC | PRN
  Administered 2023-04-09 (×2): 20 mg via INTRAVENOUS
  Administered 2023-04-09: 10 mg via INTRAVENOUS

## 2023-04-09 MED ORDER — ASPIRIN 325 MG PO TBEC: 325.0000 mg | DELAYED_RELEASE_TABLET | Freq: Every day | ORAL | 0 refills | Status: AC

## 2023-04-09 MED ORDER — MIDAZOLAM HCL 2 MG/2ML IJ SOLN
INTRAMUSCULAR | Status: DC | PRN
  Administered 2023-04-09: 2 mg via INTRAVENOUS

## 2023-04-09 MED ORDER — RINGERS IRRIGATION IR SOLN
Status: DC | PRN
  Administered 2023-04-09 (×2): 6000 mL
  Administered 2023-04-09: 9000 mL
  Administered 2023-04-09: 6000 mL

## 2023-04-09 MED ORDER — ACETAMINOPHEN 10 MG/ML IV SOLN
INTRAVENOUS | Status: AC
  Filled 2023-04-09: qty 100

## 2023-04-09 MED ORDER — SUCCINYLCHOLINE CHLORIDE 200 MG/10ML IV SOSY
PREFILLED_SYRINGE | INTRAVENOUS | Status: DC | PRN
  Administered 2023-04-09: 180 mg via INTRAVENOUS

## 2023-04-09 MED ORDER — FENTANYL CITRATE (PF) 100 MCG/2ML IJ SOLN
INTRAMUSCULAR | Status: AC
  Filled 2023-04-09: qty 2

## 2023-04-09 MED ORDER — PHENYLEPHRINE HCL-NACL 20-0.9 MG/250ML-% IV SOLN
INTRAVENOUS | Status: DC | PRN
Start: 2023-04-09 — End: 2023-04-09
  Administered 2023-04-09: 50 ug/min via INTRAVENOUS

## 2023-04-09 MED ORDER — ACETAMINOPHEN 10 MG/ML IV SOLN
INTRAVENOUS | Status: DC | PRN
  Administered 2023-04-09: 1000 mg via INTRAVENOUS

## 2023-04-09 MED ORDER — MIDAZOLAM HCL 2 MG/2ML IJ SOLN
INTRAMUSCULAR | Status: AC
  Filled 2023-04-09: qty 2

## 2023-04-09 MED ORDER — 0.9 % SODIUM CHLORIDE (POUR BTL) OPTIME
TOPICAL | Status: DC | PRN
  Administered 2023-04-09: 500 mL

## 2023-04-09 SURGICAL SUPPLY — 75 items
ADAPTER IRRIG TUBE 2 SPIKE SOL (ADAPTER) ×6 IMPLANT
ADH SKN CLS APL DERMABOND .7 (GAUZE/BANDAGES/DRESSINGS) ×2
ADPR TBG 2 SPK PMP STRL ASCP (ADAPTER) ×4
APL PRP STRL LF DISP 70% ISPRP (MISCELLANEOUS) ×2
BLADE FULL RADIUS 3.5 (BLADE) ×3 IMPLANT
BLADE SHAVER 4.5X7 STR FR (MISCELLANEOUS) ×3 IMPLANT
BLADE SURG SZ10 CARB STEEL (BLADE) ×3 IMPLANT
BLADE SURG SZ11 CARB STEEL (BLADE) ×3 IMPLANT
BNDG CMPR 5X6 CHSV STRCH STRL (GAUZE/BANDAGES/DRESSINGS) ×2
BNDG CMPR 6 X 5 YARDS HK CLSR (GAUZE/BANDAGES/DRESSINGS) ×2
BNDG COHESIVE 6X5 TAN ST LF (GAUZE/BANDAGES/DRESSINGS) ×3 IMPLANT
BNDG ELASTIC 6INX 5YD STR LF (GAUZE/BANDAGES/DRESSINGS) ×3 IMPLANT
BNDG ESMARCH 6 X 12 STRL LF (GAUZE/BANDAGES/DRESSINGS) ×2
BNDG ESMARCH 6X12 STRL LF (GAUZE/BANDAGES/DRESSINGS) ×3 IMPLANT
BRUSH SCRUB EZ 4% CHG (MISCELLANEOUS) ×3 IMPLANT
BUR BR 5.5 WIDE MOUTH (BURR) IMPLANT
CARTRIDGE SUT 2-0 NONSTITCH (Anchor) IMPLANT
CHLORAPREP W/TINT 26 (MISCELLANEOUS) ×3 IMPLANT
COOLER POLAR GLACIER W/PUMP (MISCELLANEOUS) ×3 IMPLANT
COVER LIGHT HANDLE STERIS (MISCELLANEOUS) ×6 IMPLANT
CUFF TOURN SGL QUICK 24 (TOURNIQUET CUFF)
CUFF TOURN SGL QUICK 34 (TOURNIQUET CUFF) ×2
CUFF TRNQT CYL 24X4X16.5-23 (TOURNIQUET CUFF) IMPLANT
CUFF TRNQT CYL 34X4.125X (TOURNIQUET CUFF) IMPLANT
CUTTER SUT KNOT PUSHER AIR (CUTTER) IMPLANT
DERMABOND ADVANCED .7 DNX12 (GAUZE/BANDAGES/DRESSINGS) ×3 IMPLANT
DEVICE MENISCAL CVD UP (Anchor) ×6 IMPLANT
DEVICE SUCT BLK HOLE OR FLOOR (MISCELLANEOUS) ×3 IMPLANT
DRAPE ARTHRO LIMB 89X125 STRL (DRAPES) ×6 IMPLANT
DRAPE IMP U-DRAPE 54X76 (DRAPES) ×3 IMPLANT
ELECT REM PT RETURN 9FT ADLT (ELECTROSURGICAL) ×2
ELECTRODE REM PT RTRN 9FT ADLT (ELECTROSURGICAL) ×3 IMPLANT
GAUZE SPONGE 4X4 12PLY STRL (GAUZE/BANDAGES/DRESSINGS) ×3 IMPLANT
GLOVE BIOGEL PI IND STRL 8 (GLOVE) ×3 IMPLANT
GLOVE ORTHO TXT STRL SZ7.5 (GLOVE) ×3 IMPLANT
GLOVE SURG ORTHO 8.0 STRL STRW (GLOVE) ×6 IMPLANT
GLOVE SURG SYN 7.5 E (GLOVE) ×2 IMPLANT
GLOVE SURG SYN 7.5 PF PI (GLOVE) ×3 IMPLANT
GOWN STRL REUS W/ TWL LRG LVL3 (GOWN DISPOSABLE) ×3 IMPLANT
GOWN STRL REUS W/ TWL XL LVL3 (GOWN DISPOSABLE) ×6 IMPLANT
GOWN STRL REUS W/TWL LRG LVL3 (GOWN DISPOSABLE) ×2
GOWN STRL REUS W/TWL XL LVL3 (GOWN DISPOSABLE) ×4
IV LACTATED RINGER IRRG 3000ML (IV SOLUTION) ×20
IV LR IRRIG 3000ML ARTHROMATIC (IV SOLUTION) ×12 IMPLANT
KIT TURNOVER KIT A (KITS) ×3 IMPLANT
MANAGER SUT NOVOCUT (CUTTER) IMPLANT
MANIFOLD NEPTUNE II (INSTRUMENTS) ×6 IMPLANT
MAT ABSORB FLUID 56X50 GRAY (MISCELLANEOUS) ×6 IMPLANT
NDL SUT 2-0 SCORPION KNEE (NEEDLE) IMPLANT
NEEDLE SUT 2-0 SCORPION KNEE (NEEDLE) ×2 IMPLANT
NOVOSTICH PRO MENISCAL 2-0 (Miscellaneous) ×2 IMPLANT
NS IRRIG 500ML POUR BTL (IV SOLUTION) IMPLANT
PACK ARTHROSCOPY KNEE (MISCELLANEOUS) ×3 IMPLANT
PAD ABD DERMACEA PRESS 5X9 (GAUZE/BANDAGES/DRESSINGS) ×6 IMPLANT
PAD WRAPON POLAR KNEE (MISCELLANEOUS) ×3 IMPLANT
PADDING CAST COTTON 6X4 NS (MISCELLANEOUS) ×3 IMPLANT
PADDING CAST COTTON 6X4 STRL (CAST SUPPLIES) ×3 IMPLANT
SLEEVE REMOTE CONTROL 5X12 (DRAPES) IMPLANT
SPONGE T-LAP 18X18 ~~LOC~~+RFID (SPONGE) ×3 IMPLANT
SUT ETHILON 3-0 FS-10 30 BLK (SUTURE) ×2
SUT MNCRL 4-0 (SUTURE) ×2
SUT MNCRL 4-0 27XMFL (SUTURE) ×2
SUT VIC AB 2-0 CT2 27 (SUTURE) ×3 IMPLANT
SUTURE EHLN 3-0 FS-10 30 BLK (SUTURE) ×3 IMPLANT
SUTURE MNCRL 4-0 27XMF (SUTURE) ×3 IMPLANT
SYS ANCHOR SUT W/2-0 BLU CO-BR (Anchor) IMPLANT
SYSTEM NVSTCH PRO MENISCAL 2-0 (Miscellaneous) IMPLANT
TAPE TRANSPORE STRL 2 31045 (GAUZE/BANDAGES/DRESSINGS) ×3 IMPLANT
TOWEL OR 17X26 4PK STRL BLUE (TOWEL DISPOSABLE) ×6 IMPLANT
TRAP FLUID SMOKE EVACUATOR (MISCELLANEOUS) ×3 IMPLANT
TUBE SET DOUBLEFLO INFLOW (TUBING) ×3 IMPLANT
TUBE SET DOUBLEFLO OUTFLOW (TUBING) ×3 IMPLANT
WAND WEREWOLF FLOW 90D (MISCELLANEOUS) ×3 IMPLANT
WATER STERILE IRR 500ML POUR (IV SOLUTION) ×3 IMPLANT
WRAPON POLAR PAD KNEE (MISCELLANEOUS) ×2

## 2023-04-09 NOTE — Op Note (Addendum)
Operative Note    SURGERY DATE:04/09/2023      PRE-OP DIAGNOSIS:  1. Right medial meniscus bucket-handle tear   POST-OP DIAGNOSIS:  1.  Right medial meniscus bucket-handle tear 2.  Right knee loose/foreign body    PROCEDURES:  1. Right knee arthroscopy, all-inside medial meniscus repair of bucket-handle tear 2. 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 He has had difficulty with weightbearing and regaining full knee extension. Clinical exam and MRI were consistent with a bucket handle medial meniscus tear. He has has had prior ACL reconstruction in 2021 that appears intact on MRI and clinically. Medial and lateral meniscus repairs were performed at the same time. After discussion of risks, benefits, and alternatives to surgery, the patient elected to proceed.  The patient understands that there is a higher risk of re-tear with meniscus repair, but would prefer repair to maintain the normal biomechanics and structure of the knee. The patient is willing to perform the appropriate rehab and maintain weight-bearing restrictions post-operatively.   OPERATIVE FINDINGS:    Examination under anesthesia: A careful examination under anesthesia was performed.  Passive range of motion was: Hyperextension: 0.  Extension: 2.  Flexion: 140.  Lachman: normal. Pivot Shift: normal.  Posterior drawer: normal.  Varus stability in full extension: normal.  Varus stability in 30 degrees of flexion: normal.  Valgus stability in full extension: normal.  Valgus stability in 30 degrees of flexion: normal.   Intra-operative findings: A thorough arthroscopic examination of the knee was performed.  The findings are: 1.  Suprapatellar pouch: Normal 2. Undersurface of median ridge: Grade 1 softening 3. Medial patellar facet: Grade 1 softening 4. Lateral patellar facet: Grade 1 softening 5. Trochlea: Normal 6. Lateral gutter/popliteus tendon: Normal 7. Hoffa's fat pad: Normal 8. Medial gutter/plica: Normal 9. ACL: Intact graft with suture tape augmentation 10. PCL: Normal 11. Medial meniscus: Bucket handle tear of the entire posterior horn and body at the red-white zone at the posterior horn and the red-red zone at the body 12. Medial compartment cartilage: Scattered areas of Grade 1-2 degenerative changes to the tibial plateau and medial femoral condyle 13. Lateral meniscus: Normal, prior repair sutures in place without tear 14. Lateral compartment cartilage: Normal   OPERATIVE REPORT:     I identified the patient in the pre-operative holding area.  I marked the operative knee with my initials. I reviewed the risks and benefits of the proposed surgical intervention, and the patient wished to proceed. The patient was transferred to the operative suite and placed in the supine position with all bony prominences padded.  Anesthesia was administered. Appropriate IV antibiotics were administered within 30 minutes of incision. The extremity was then prepped and draped in standard fashion. A time out was performed confirming the correct extremity, correct patient, and correct procedure.   Arthroscopy portals were marked. Local anesthetic was injected to the planned portal sites. The anterolateral portal was established with an 11 blade. The arthroscope was placed in the anterolateral portal and then into the suprapatellar pouch.  The medial meniscus tear was identified. Next, the medial portal was established under needle localization. The bucket handle tear was reduced with a probe. A spinal needle was  used to pie-crust the MCL to allow for better visualization and protect the cartilage surfaces during instrumentation. A  diagnostic knee scope was completed with the above findings. Prior repair sutures and and implant from FastFix suture were noted to be loose about the posterior horn of the medial meniscus. These were removed with an arthroscopic grasper. Largest suture measured ~89mm, including the implant.    The edges of the meniscus tear and capsule were roughened with a rasp and shaver to create a more optimal healing surface. Ceterix Novostitch x4 all-inside sutures were passed in a mattress fashion across the posterior horn along the length of the tear. Arthroscopic knots were tied for each suture, allowing for anatomic reduction. An additional 2 sutures were passed in a similar fashion after switching portals at the posterior horn/body juction. Stryker Ivy AIR all-inside sutures x 3 were placed to reduce the meniscus body.  Two sutures were placed on the femoral side and one was placed on the tibial side. Therefore, a total of 9 stitches were placed.  Afterwards, the meniscus was probed and felt to be anatomically reduce and stable. Microfracture of the intercondylar notch was then performed to allow for improved meniscus healing. Arthroscopic fluid was removed from the joint.   The portals were closed with 3-0 Nylon suture. Sterile dressings included Xeroform, 4x4s, Sof-Rol, and Bias wrap. Tourniquet was released with time of 90 minutes. A Polarcare was placed. A T-scope hinged knee brace was applied.  The patient was then awakened and taken to the PACU hemodynamically stable without complication.   Of note, assistance from a PA was essential to performing the surgery.  PA was present for the entire surgery.  PA assisted with patient positioning, retraction, instrumentation, and wound closure. The surgery would have been more difficult and had longer operative time without PA assistance.     POSTOPERATIVE PLAN: The patient will be discharged home today once they meet PACU criteria. Aspirin 325 mg daily was  prescribed for 4 weeks for DVT prophylaxis.  Physical therapy will start on POD#3-4. FFWB with brace locked in extension x 4 weeks. F/U in 2 weeks.

## 2023-04-09 NOTE — H&P (Signed)
Paper H&P to be scanned into permanent record. H&P reviewed. No significant changes noted.  

## 2023-04-09 NOTE — Discharge Instructions (Addendum)
Arthroscopic Knee Surgery - Meniscus Repair   Post-Op Instructions   1. Bracing or crutches: Crutches will be provided at the time of discharge from the surgery center. Keep brace locked in extension at all times except as directed by physical therapy.    2. Ice: You may be provided with a device [facility]) that allows you to ice the affected area effectively. Otherwise you can ice manually.    3. Driving:  Plan on not driving for at least four weeks. Please note that you are advised NOT to drive while taking narcotic pain medications as you may be impaired and unsafe to drive.   4. Activity: Ankle pumps several times an hour while awake to prevent blood clots. Weight bearing: FLAT FOOT WEIGHT BEARING (weight of leg only) FOR 4 WEEKS. Use crutches for at least 4 weeks, if not 6 based on your surgery. Bending and straightening the knee is unlimited, but do not flex your knee past 90 degrees until cleared by your therapist. Elevate knee above heart level as much as possible for one week. Avoid standing more than 5 minutes (consecutively) for the first week. No exercise involving the knee until cleared by the surgeon or physical therapist.  Avoid long distance travel for 4 weeks.   5. Medications:  - You have been provided a prescription for narcotic pain medicine. After surgery, take 1-2 narcotic tablets every 4 hours if needed for severe pain. If it has tylenol (acetaminophen), please do not take a total of more than 3000mg /day of tylenol.  - A prescription for anti-nausea medication will be provided in case the narcotic medicine causes nausea - take 1 tablet every 6 hours only if nauseated.  - Take ibuprofen 800 mg every 8 hours with food to reduce post-operative knee swelling. DO NOT STOP IBUPROFEN POST-OP UNTIL INSTRUCTED TO DO SO at first post-op office visit (10-14 days after surgery).  - Take enteric coated aspirin 325 mg once daily for 4 weeks to prevent blood clots.  -Take tylenol 1000  every 8 hours for pain.  May stop tylenol 3 days after surgery or when you are having minimal pain. If your narcotic has tylenol (acetaminophen), please do not take a total of more than 3000mg /day of tylenol.    If you are taking prescription medication for anxiety, depression, insomnia, muscle spasm, chronic pain, or for attention deficit disorder you are advised that you are at a higher risk of adverse effects with use of narcotics post-op, including narcotic addiction/dependence, depressed breathing, death. If you use non-prescribed substances: alcohol, marijuana, cocaine, heroin, methamphetamines, etc., you are at a higher risk of adverse effects with use of narcotics post-op, including narcotic addiction/dependence, depressed breathing, death. You are advised that taking > 50 morphine milligram equivalents (MME) of narcotic pain medication per day results in twice the risk of overdose or death. For your prescription provided: oxycodone 5 mg - taking more than 6 tablets per day. Be advised that we will prescribe narcotics short-term, for acute post-operative pain only - 1 week for minor operations such as knee arthroscopy for meniscus tear resection, and 3 weeks for major operations such as knee repair/reconstruction surgeries.   6. Bandages: The physical therapist should change the bandages at the first post-op appointment. If needed, the dressing supplies have been provided to you. You may shower after this with waterproof bandaids covering the incisions.    7. Physical Therapy: 2 times per week for the first 4 weeks, then 1-2 times per week from  weeks 4-8 post-op. Therapy typically starts on post operative Day 3 or 4. You have been provided an order for physical therapy. The therapist will provide home exercises.   8. Work: May return to full work when off of crutches. May do light duty/desk job in approximately 1-2 weeks when off of narcotics, pain is well-controlled, and swelling has decreased.    9. Post-Op Appointments: Your first post-op appointment will be with Dr. Allena Katz in approximately 2 weeks time.    If you find that they have not been scheduled please call the Orthopaedic Appointment front desk at [phone].  AMBULATORY SURGERY  DISCHARGE INSTRUCTIONS   The drugs that you were given will stay in your system until tomorrow so for the next 24 hours you should not:  Drive an automobile Make any legal decisions Drink any alcoholic beverage   You may resume regular meals tomorrow.  Today it is better to start with liquids and gradually work up to solid foods.  You may eat anything you prefer, but it is better to start with liquids, then soup and crackers, and gradually work up to solid foods.   Please notify your doctor immediately if you have any unusual bleeding, trouble breathing, redness and pain at the surgery site, drainage, fever, or pain not relieved by medication.     Please contact your physician with any problems or Same Day Surgery at [phone], Monday through Friday 6 am to 4 pm, or [place] at [facility] number at [phone].

## 2023-04-09 NOTE — Anesthesia Procedure Notes (Signed)
Procedure Name: Intubation Date/Time: 04/09/2023 7:52 AM  Performed by: Maryla Morrow., CRNAPre-anesthesia Checklist: Patient identified, Patient being monitored, Timeout performed, Emergency Drugs available and Suction available Patient Re-evaluated:Patient Re-evaluated prior to induction Oxygen Delivery Method: Circle system utilized Preoxygenation: Pre-oxygenation with 100% oxygen Induction Type: IV induction Ventilation: Mask ventilation without difficulty Laryngoscope Size: Mac and 4 Grade View: Grade II Tube type: Oral Tube size: 7.5 mm Number of attempts: 1 Airway Equipment and Method: Stylet Placement Confirmation: ETT inserted through vocal cords under direct vision, positive ETCO2 and breath sounds checked- equal and bilateral Secured at: 23 cm Tube secured with: Tape Dental Injury: Teeth and Oropharynx as per pre-operative assessment

## 2023-04-09 NOTE — Transfer of Care (Signed)
Immediate Anesthesia Transfer of Care Note  Patient: Tanner Reed  Procedure(s) Performed: Right arthroscopic medial meniscus repair , chondroplasty (Right: Knee) Right arthroscopic medial meniscus repair vs partial meniscectomy, possible chondroplasty (Right) Right arthroscopic medial meniscus repair vs partial meniscectomy, possible chondroplasty (Right: Knee)  Patient Location: PACU  Anesthesia Type:General  Level of Consciousness: drowsy  Airway & Oxygen Therapy: Patient Spontanous Breathing  Post-op Assessment: Report given to RN and Post -op Vital signs reviewed and stable  Post vital signs: stable  Last Vitals:  Vitals Value Taken Time  BP 132/64 04/09/23 1015  Temp 36.8 C 04/09/23 1007  Pulse 77 04/09/23 1020  Resp 17 04/09/23 1020  SpO2 97 % 04/09/23 1020  Vitals shown include unfiled device data.  Last Pain:  Vitals:   04/09/23 1007  TempSrc:   PainSc: Asleep         Complications: There were no known notable events for this encounter.

## 2023-04-09 NOTE — Anesthesia Preprocedure Evaluation (Signed)
Anesthesia Evaluation  Patient identified by MRN, date of birth, ID band Patient awake    Reviewed: Allergy & Precautions, H&P , NPO status , Patient's Chart, lab work & pertinent test results, reviewed documented beta blocker date and time   Airway Mallampati: II  TM Distance: >3 FB Neck ROM: full    Dental  (+) Teeth Intact   Pulmonary asthma    Pulmonary exam normal        Cardiovascular Exercise Tolerance: Good negative cardio ROS Normal cardiovascular exam Rhythm:regular Rate:Normal     Neuro/Psych negative neurological ROS  negative psych ROS   GI/Hepatic negative GI ROS, Neg liver ROS,,,  Endo/Other  negative endocrine ROS    Renal/GU negative Renal ROS  negative genitourinary   Musculoskeletal   Abdominal   Peds  Hematology negative hematology ROS (+)   Anesthesia Other Findings Past Medical History: 2021: ACL tear No date: Asthma     Comment:  as a child 03/2023: Medial meniscus tear     Comment:  right 2021: Medial meniscus tear No date: STD (sexually transmitted disease) Past Surgical History: 2021: ARTHROSCOPIC REPAIR ACL Right  OPEN MENISCAL TRANSPLANTATION  Right ; Right No date: WISDOM TOOTH EXTRACTION   Reproductive/Obstetrics negative OB ROS                             Anesthesia Physical Anesthesia Plan  ASA: 2  Anesthesia Plan: General ETT   Post-op Pain Management: Regional block*   Induction:   PONV Risk Score and Plan: 3  Airway Management Planned:   Additional Equipment:   Intra-op Plan:   Post-operative Plan:   Informed Consent: I have reviewed the patients History and Physical, chart, labs and discussed the procedure including the risks, benefits and alternatives for the proposed anesthesia with the patient or authorized representative who has indicated his/her understanding and acceptance.     Dental Advisory Given  Plan Discussed  with: CRNA  Anesthesia Plan Comments:        Anesthesia Quick Evaluation

## 2023-04-10 ENCOUNTER — Encounter: Payer: Self-pay | Admitting: Orthopedic Surgery

## 2023-04-11 DIAGNOSIS — Z419 Encounter for procedure for purposes other than remedying health state, unspecified: Secondary | ICD-10-CM | POA: Diagnosis not present

## 2023-04-11 NOTE — Anesthesia Postprocedure Evaluation (Signed)
Anesthesia Post Note  Patient: Tanner Reed  Procedure(s) Performed: Right arthroscopic medial meniscus repair , chondroplasty (Right: Knee) Right arthroscopic medial meniscus repair vs partial meniscectomy, possible chondroplasty (Right) Right arthroscopic medial meniscus repair vs partial meniscectomy, possible chondroplasty (Right: Knee)  Anesthesia Type: General Anesthetic complications: no   There were no known notable events for this encounter.   Last Vitals:  Vitals:   04/09/23 1200 04/09/23 1215  BP: 138/86 136/84  Pulse: 93 88  Resp: 16 14  Temp: 36.9 C 37 C  SpO2: 100% 100%    Last Pain:  Vitals:   04/09/23 1215  TempSrc: Temporal  PainSc:                  Yevette Edwards

## 2023-04-12 DIAGNOSIS — G8929 Other chronic pain: Secondary | ICD-10-CM | POA: Diagnosis not present

## 2023-04-12 DIAGNOSIS — M25561 Pain in right knee: Secondary | ICD-10-CM | POA: Diagnosis not present

## 2023-04-18 DIAGNOSIS — M25561 Pain in right knee: Secondary | ICD-10-CM | POA: Diagnosis not present

## 2023-04-18 DIAGNOSIS — G8929 Other chronic pain: Secondary | ICD-10-CM | POA: Diagnosis not present

## 2023-04-26 DIAGNOSIS — G8929 Other chronic pain: Secondary | ICD-10-CM | POA: Diagnosis not present

## 2023-04-26 DIAGNOSIS — M25561 Pain in right knee: Secondary | ICD-10-CM | POA: Diagnosis not present

## 2023-05-12 DIAGNOSIS — Z419 Encounter for procedure for purposes other than remedying health state, unspecified: Secondary | ICD-10-CM | POA: Diagnosis not present

## 2023-05-16 DIAGNOSIS — M6281 Muscle weakness (generalized): Secondary | ICD-10-CM | POA: Diagnosis not present

## 2023-05-16 DIAGNOSIS — Z9889 Other specified postprocedural states: Secondary | ICD-10-CM | POA: Diagnosis not present

## 2023-05-16 DIAGNOSIS — Z4789 Encounter for other orthopedic aftercare: Secondary | ICD-10-CM | POA: Diagnosis not present

## 2023-05-16 DIAGNOSIS — M25561 Pain in right knee: Secondary | ICD-10-CM | POA: Diagnosis not present

## 2023-05-21 DIAGNOSIS — Z9889 Other specified postprocedural states: Secondary | ICD-10-CM | POA: Diagnosis not present

## 2023-05-21 DIAGNOSIS — M25561 Pain in right knee: Secondary | ICD-10-CM | POA: Diagnosis not present

## 2023-05-21 DIAGNOSIS — Z4789 Encounter for other orthopedic aftercare: Secondary | ICD-10-CM | POA: Diagnosis not present

## 2023-05-21 DIAGNOSIS — M6281 Muscle weakness (generalized): Secondary | ICD-10-CM | POA: Diagnosis not present

## 2023-06-05 DIAGNOSIS — M6281 Muscle weakness (generalized): Secondary | ICD-10-CM | POA: Diagnosis not present

## 2023-06-05 DIAGNOSIS — Z4789 Encounter for other orthopedic aftercare: Secondary | ICD-10-CM | POA: Diagnosis not present

## 2023-06-05 DIAGNOSIS — Z9889 Other specified postprocedural states: Secondary | ICD-10-CM | POA: Diagnosis not present

## 2023-06-05 DIAGNOSIS — M25561 Pain in right knee: Secondary | ICD-10-CM | POA: Diagnosis not present

## 2023-06-11 DIAGNOSIS — Z419 Encounter for procedure for purposes other than remedying health state, unspecified: Secondary | ICD-10-CM | POA: Diagnosis not present

## 2023-06-18 DIAGNOSIS — Z9889 Other specified postprocedural states: Secondary | ICD-10-CM | POA: Diagnosis not present

## 2023-06-18 DIAGNOSIS — M6281 Muscle weakness (generalized): Secondary | ICD-10-CM | POA: Diagnosis not present

## 2023-06-18 DIAGNOSIS — S86911S Strain of unspecified muscle(s) and tendon(s) at lower leg level, right leg, sequela: Secondary | ICD-10-CM | POA: Diagnosis not present

## 2023-06-18 DIAGNOSIS — M25561 Pain in right knee: Secondary | ICD-10-CM | POA: Diagnosis not present

## 2023-06-20 DIAGNOSIS — M25561 Pain in right knee: Secondary | ICD-10-CM | POA: Diagnosis not present

## 2023-06-20 DIAGNOSIS — Z9889 Other specified postprocedural states: Secondary | ICD-10-CM | POA: Diagnosis not present

## 2023-06-20 DIAGNOSIS — S86911S Strain of unspecified muscle(s) and tendon(s) at lower leg level, right leg, sequela: Secondary | ICD-10-CM | POA: Diagnosis not present

## 2023-06-20 DIAGNOSIS — M6281 Muscle weakness (generalized): Secondary | ICD-10-CM | POA: Diagnosis not present

## 2023-07-04 DIAGNOSIS — M25561 Pain in right knee: Secondary | ICD-10-CM | POA: Diagnosis not present

## 2023-07-04 DIAGNOSIS — Z9889 Other specified postprocedural states: Secondary | ICD-10-CM | POA: Diagnosis not present

## 2023-07-04 DIAGNOSIS — S86911S Strain of unspecified muscle(s) and tendon(s) at lower leg level, right leg, sequela: Secondary | ICD-10-CM | POA: Diagnosis not present

## 2023-07-04 DIAGNOSIS — M6281 Muscle weakness (generalized): Secondary | ICD-10-CM | POA: Diagnosis not present

## 2023-07-11 DIAGNOSIS — Z9889 Other specified postprocedural states: Secondary | ICD-10-CM | POA: Diagnosis not present

## 2023-07-11 DIAGNOSIS — M25561 Pain in right knee: Secondary | ICD-10-CM | POA: Diagnosis not present

## 2023-07-11 DIAGNOSIS — M6281 Muscle weakness (generalized): Secondary | ICD-10-CM | POA: Diagnosis not present

## 2023-07-11 DIAGNOSIS — S86911S Strain of unspecified muscle(s) and tendon(s) at lower leg level, right leg, sequela: Secondary | ICD-10-CM | POA: Diagnosis not present

## 2023-07-12 DIAGNOSIS — Z419 Encounter for procedure for purposes other than remedying health state, unspecified: Secondary | ICD-10-CM | POA: Diagnosis not present

## 2023-07-16 DIAGNOSIS — Z9889 Other specified postprocedural states: Secondary | ICD-10-CM | POA: Diagnosis not present

## 2023-07-16 DIAGNOSIS — M25561 Pain in right knee: Secondary | ICD-10-CM | POA: Diagnosis not present

## 2023-07-16 DIAGNOSIS — M6281 Muscle weakness (generalized): Secondary | ICD-10-CM | POA: Diagnosis not present

## 2023-07-19 DIAGNOSIS — Z9889 Other specified postprocedural states: Secondary | ICD-10-CM | POA: Diagnosis not present

## 2023-07-19 DIAGNOSIS — M25561 Pain in right knee: Secondary | ICD-10-CM | POA: Diagnosis not present

## 2023-07-19 DIAGNOSIS — M6281 Muscle weakness (generalized): Secondary | ICD-10-CM | POA: Diagnosis not present

## 2023-07-23 DIAGNOSIS — M25561 Pain in right knee: Secondary | ICD-10-CM | POA: Diagnosis not present

## 2023-07-23 DIAGNOSIS — M6281 Muscle weakness (generalized): Secondary | ICD-10-CM | POA: Diagnosis not present

## 2023-07-23 DIAGNOSIS — Z9889 Other specified postprocedural states: Secondary | ICD-10-CM | POA: Diagnosis not present

## 2023-07-25 DIAGNOSIS — M6281 Muscle weakness (generalized): Secondary | ICD-10-CM | POA: Diagnosis not present

## 2023-07-25 DIAGNOSIS — M25561 Pain in right knee: Secondary | ICD-10-CM | POA: Diagnosis not present

## 2023-07-25 DIAGNOSIS — Z9889 Other specified postprocedural states: Secondary | ICD-10-CM | POA: Diagnosis not present

## 2023-07-31 DIAGNOSIS — J069 Acute upper respiratory infection, unspecified: Secondary | ICD-10-CM | POA: Diagnosis not present

## 2023-08-11 DIAGNOSIS — Z419 Encounter for procedure for purposes other than remedying health state, unspecified: Secondary | ICD-10-CM | POA: Diagnosis not present

## 2023-08-21 DIAGNOSIS — M25561 Pain in right knee: Secondary | ICD-10-CM | POA: Diagnosis not present

## 2023-08-21 DIAGNOSIS — Z9889 Other specified postprocedural states: Secondary | ICD-10-CM | POA: Diagnosis not present

## 2023-08-21 DIAGNOSIS — M6281 Muscle weakness (generalized): Secondary | ICD-10-CM | POA: Diagnosis not present

## 2023-08-22 DIAGNOSIS — M6281 Muscle weakness (generalized): Secondary | ICD-10-CM | POA: Diagnosis not present

## 2023-08-22 DIAGNOSIS — Z9889 Other specified postprocedural states: Secondary | ICD-10-CM | POA: Diagnosis not present

## 2023-08-22 DIAGNOSIS — M25561 Pain in right knee: Secondary | ICD-10-CM | POA: Diagnosis not present

## 2023-09-11 DIAGNOSIS — Z419 Encounter for procedure for purposes other than remedying health state, unspecified: Secondary | ICD-10-CM | POA: Diagnosis not present

## 2023-09-24 DIAGNOSIS — Z9889 Other specified postprocedural states: Secondary | ICD-10-CM | POA: Diagnosis not present

## 2023-10-12 DIAGNOSIS — Z419 Encounter for procedure for purposes other than remedying health state, unspecified: Secondary | ICD-10-CM | POA: Diagnosis not present

## 2023-11-09 DIAGNOSIS — Z419 Encounter for procedure for purposes other than remedying health state, unspecified: Secondary | ICD-10-CM | POA: Diagnosis not present

## 2023-12-13 DIAGNOSIS — S39012A Strain of muscle, fascia and tendon of lower back, initial encounter: Secondary | ICD-10-CM | POA: Diagnosis not present

## 2023-12-21 DIAGNOSIS — Z419 Encounter for procedure for purposes other than remedying health state, unspecified: Secondary | ICD-10-CM | POA: Diagnosis not present

## 2024-01-20 DIAGNOSIS — Z419 Encounter for procedure for purposes other than remedying health state, unspecified: Secondary | ICD-10-CM | POA: Diagnosis not present

## 2024-02-20 DIAGNOSIS — Z419 Encounter for procedure for purposes other than remedying health state, unspecified: Secondary | ICD-10-CM | POA: Diagnosis not present

## 2024-03-21 DIAGNOSIS — Z419 Encounter for procedure for purposes other than remedying health state, unspecified: Secondary | ICD-10-CM | POA: Diagnosis not present

## 2024-04-21 DIAGNOSIS — Z419 Encounter for procedure for purposes other than remedying health state, unspecified: Secondary | ICD-10-CM | POA: Diagnosis not present

## 2024-05-22 DIAGNOSIS — Z419 Encounter for procedure for purposes other than remedying health state, unspecified: Secondary | ICD-10-CM | POA: Diagnosis not present
